# Patient Record
Sex: Male | Born: 1970
Health system: Southern US, Community
[De-identification: ages and names within clinical notes are randomized; demographics above are authoritative.]

## PROBLEM LIST (undated history)

## (undated) DIAGNOSIS — Z789 Other specified health status: Secondary | ICD-10-CM

## (undated) HISTORY — PX: WRIST FRACTURE SURGERY: SHX121

## (undated) HISTORY — PX: NO PAST SURGERIES: SHX2092

---

## 2004-10-31 ENCOUNTER — Emergency Department (HOSPITAL_COMMUNITY): Admission: EM | Admit: 2004-10-31 | Discharge: 2004-10-31 | Payer: Self-pay | Admitting: Emergency Medicine

## 2009-02-08 ENCOUNTER — Emergency Department (HOSPITAL_COMMUNITY): Admission: EM | Admit: 2009-02-08 | Discharge: 2009-02-08 | Payer: Self-pay | Admitting: Emergency Medicine

## 2009-04-04 ENCOUNTER — Emergency Department (HOSPITAL_COMMUNITY): Admission: RE | Admit: 2009-04-04 | Discharge: 2009-04-05 | Payer: Self-pay | Admitting: Family Medicine

## 2010-08-18 LAB — URINALYSIS, ROUTINE W REFLEX MICROSCOPIC
Glucose, UA: NEGATIVE mg/dL
Ketones, ur: >80 mg/dL — AB
Protein, ur: NEGATIVE mg/dL
Urobilinogen, UA: 0.2 mg/dL (ref 0.0–1.0)

## 2010-08-18 LAB — URINE MICROSCOPIC-ADD ON

## 2014-08-31 ENCOUNTER — Encounter (HOSPITAL_COMMUNITY): Payer: Self-pay | Admitting: Emergency Medicine

## 2014-08-31 ENCOUNTER — Emergency Department (HOSPITAL_COMMUNITY): Payer: Worker's Compensation

## 2014-08-31 ENCOUNTER — Emergency Department (HOSPITAL_COMMUNITY)
Admission: EM | Admit: 2014-08-31 | Discharge: 2014-08-31 | Disposition: A | Discharge status: Home / Self Care | Payer: Worker's Compensation | Attending: Emergency Medicine | Admitting: Emergency Medicine

## 2014-08-31 DIAGNOSIS — Y998 Other external cause status: Secondary | ICD-10-CM | POA: Insufficient documentation

## 2014-08-31 DIAGNOSIS — W11XXXA Fall on and from ladder, initial encounter: Secondary | ICD-10-CM | POA: Insufficient documentation

## 2014-08-31 DIAGNOSIS — Z791 Long term (current) use of non-steroidal anti-inflammatories (NSAID): Secondary | ICD-10-CM | POA: Diagnosis not present

## 2014-08-31 DIAGNOSIS — S6991XA Unspecified injury of right wrist, hand and finger(s), initial encounter: Secondary | ICD-10-CM | POA: Diagnosis present

## 2014-08-31 DIAGNOSIS — S62101A Fracture of unspecified carpal bone, right wrist, initial encounter for closed fracture: Secondary | ICD-10-CM

## 2014-08-31 DIAGNOSIS — Y9389 Activity, other specified: Secondary | ICD-10-CM | POA: Diagnosis not present

## 2014-08-31 DIAGNOSIS — Y92009 Unspecified place in unspecified non-institutional (private) residence as the place of occurrence of the external cause: Secondary | ICD-10-CM | POA: Diagnosis not present

## 2014-08-31 DIAGNOSIS — S52611A Displaced fracture of right ulna styloid process, initial encounter for closed fracture: Secondary | ICD-10-CM | POA: Diagnosis not present

## 2014-08-31 MED ORDER — HYDROCODONE-ACETAMINOPHEN 5-325 MG PO TABS: 1.0000 | ORAL_TABLET | Freq: Four times a day (QID) | ORAL | Status: DC | PRN

## 2014-08-31 MED ORDER — IBUPROFEN 800 MG PO TABS
800.0000 mg | ORAL_TABLET | Freq: Once | ORAL | Status: AC
  Administered 2014-08-31: 800 mg via ORAL
  Filled 2014-08-31: qty 1

## 2014-08-31 NOTE — ED Notes (Signed)
Pt c/o R wrist swelling and pain after falling off of a ladder yesterday. Pt denies any other injury, LOC. Pt A&Ox4 and ambulatory. Pt can move wrist and fingers with moderate pain.

## 2014-08-31 NOTE — Discharge Instructions (Signed)
Wrist Fracture °A wrist fracture is a break or crack in one of the bones of your wrist. Your wrist is made up of eight small bones at the palm of your hand (carpal bones) and two long bones that make up your forearm (radius and ulna).  °CAUSES  °· A direct blow to the wrist. °· Falling on an outstretched hand. °· Trauma, such as a car accident or a fall. °RISK FACTORS °Risk factors for wrist fracture include:  °· Participating in contact and high-risk sports, such as skiing, biking, and ice skating. °· Taking steroid medicines. °· Smoking. °· Being male. °· Being Caucasian. °· Drinking more than three alcoholic beverages per day. °· Having low or lowered bone density (osteoporosis or osteopenia). °· Age. Older adults have decreased bone density. °· Women who have had menopause. °· History of previous fractures. °SIGNS AND SYMPTOMS °Symptoms of wrist fractures include tenderness, bruising, and inflammation. Additionally, the wrist may hang in an odd position or appear deformed.  °DIAGNOSIS °Diagnosis may include: °· Physical exam. °· X-ray. °TREATMENT °Treatment depends on many factors, including the nature and location of the fracture, your age, and your activity level. Treatment for wrist fracture can be nonsurgical or surgical.  °Nonsurgical Treatment °A plaster cast or splint may be applied to your wrist if the bone is in a good position. If the fracture is not in good position, it may be necessary for your health care provider to realign it before applying a splint or cast. Usually, a cast or splint will be worn for several weeks.  °Surgical Treatment °Sometimes the position of the bone is so far out of place that surgery is required to apply a device to hold it together as it heals. Depending on the fracture, there are a number of options for holding the bone in place while it heals, such as a cast and metal pins.   °HOME CARE INSTRUCTIONS °· Keep your injured wrist elevated and move your fingers as much as  possible. °· Do not put pressure on any part of your cast or splint. It may break.   °· Use a plastic bag to protect your cast or splint from water while bathing or showering. Do not lower your cast or splint into water. °· Take medicines only as directed by your health care provider. °· Keep your cast or splint clean and dry. If it becomes wet, damaged, or suddenly feels too tight, contact your health care provider right away. °· Do not use any tobacco products including cigarettes, chewing tobacco, or electronic cigarettes. Tobacco can delay bone healing. If you need help quitting, ask your health care provider. °· Keep all follow-up visits as directed by your health care provider. This is important. °· Ask your health care provider if you should take supplements of calcium and vitamins C and D to promote bone healing. °SEEK MEDICAL CARE IF:  °· Your cast or splint is damaged, breaks, or gets wet. °· You have a fever. °· You have chills. °· You have continued severe pain or more swelling than you did before the cast was put on. °SEEK IMMEDIATE MEDICAL CARE IF:  °· Your hand or fingernails on the injured arm turn blue or gray, or feel cold or numb. °· You have decreased feeling in the fingers of your injured arm. °MAKE SURE YOU: °· Understand these instructions. °· Will watch your condition. °· Will get help right away if you are not doing well or get worse. °Document Released: 02/09/2005 Document Revised:   09/16/2013 Document Reviewed: 05/20/2011 Exit[employer] Patient Information 2015 [place], [place]. This information is not intended to replace advice given to you by your health care provider. Make sure you discuss any questions you have with your health care provider.  Cast or Splint Care Casts and splints support injured limbs and keep bones from moving while they heal. It is important to care for your cast or splint at home.  HOME CARE INSTRUCTIONS  Keep the cast or splint uncovered during the drying period.  It can take 24 to 48 hours to dry if it is made of plaster. A fiberglass cast will dry in less than 1 hour.  Do not rest the cast on anything harder than a pillow for the first 24 hours.  Do not put weight on your injured limb or apply pressure to the cast until your health care provider gives you permission.  Keep the cast or splint dry. Wet casts or splints can lose their shape and may not support the limb as well. A wet cast that has lost its shape can also create harmful pressure on your skin when it dries. Also, wet skin can become infected.  Cover the cast or splint with a plastic bag when bathing or when out in the rain or snow. If the cast is on the trunk of the body, take sponge baths until the cast is removed.  If your cast does become wet, dry it with a towel or a blow dryer on the cool setting only.  Keep your cast or splint clean. Soiled casts may be wiped with a moistened cloth.  Do not place any hard or soft foreign objects under your cast or splint, such as cotton, toilet paper, lotion, or powder.  Do not try to scratch the skin under the cast with any object. The object could get stuck inside the cast. Also, scratching could lead to an infection. If itching is a problem, use a blow dryer on a cool setting to relieve discomfort.  Do not trim or cut your cast or remove padding from inside of it.  Exercise all joints next to the injury that are not immobilized by the cast or splint. For example, if you have a long leg cast, exercise the hip joint and toes. If you have an arm cast or splint, exercise the shoulder, elbow, thumb, and fingers.  Elevate your injured arm or leg on 1 or 2 pillows for the first 1 to 3 days to decrease swelling and pain.It is best if you can comfortably elevate your cast so it is higher than your heart. SEEK MEDICAL CARE IF:   Your cast or splint cracks.  Your cast or splint is too tight or too loose.  You have unbearable itching inside the  cast.  Your cast becomes wet or develops a soft spot or area.  You have a bad smell coming from inside your cast.  You get an object stuck under your cast.  Your skin around the cast becomes red or raw.  You have new pain or worsening pain after the cast has been applied. SEEK IMMEDIATE MEDICAL CARE IF:   You have fluid leaking through the cast.  You are unable to move your fingers or toes.  You have discolored (blue or white), cool, painful, or very swollen fingers or toes beyond the cast.  You have tingling or numbness around the injured area.  You have severe pain or pressure under the cast.  You have any difficulty with your  breathing or have shortness of breath. °· You have chest pain. °Document Released: 04/29/2000 Document Revised: 02/20/2013 Document Reviewed: 11/08/2012 °ExitCare® Patient Information ©2015 [employer]. This information is not intended to replace advice given to you by your health care provider. Make sure you discuss any questions you have with your health care provider. ° ° °

## 2014-08-31 NOTE — ED Provider Notes (Signed)
CSN: [id number]     Arrival date & time 08/31/14  1125 History   First MD Initiated Contact with Patient 08/31/14 1136     Chief Complaint  Patient presents with  . Wrist Injury     (Consider location/radiation/quality/duration/timing/severity/associated sxs/prior Treatment) HPI   This is a 44 year old male who presents emergency with chief complaint of right wrist pain. Patient states that he was at home on a ladder doing some housework when he slipped, fell backward and landed upside down with all of his weight on his right hand. He had immediate severe pain and deformity. The patient states that he reached down and reduced his wrist. He states that he felt like he was going to pass out and became cold and sweaty during his self reduction. The patient went to his mother and sister who are involved in the medical field and some capacity, who told him that they thought it was not broken. He did not seek treatment yesterday. Patient states that his pain became worse progressively worse overnight and was unable to sleep. He's had worsening swelling, throbbing. He denies numbness or tingling in the hand. He took 1 Aleve without relief of his symptoms. He is right-hand dominant.   History reviewed. No pertinent past medical history. History reviewed. No pertinent past surgical history. No family history on file. History  Substance Use Topics  . Smoking status: Never Smoker   . Smokeless tobacco: Not on file  . Alcohol Use: No    Review of Systems  Ten systems reviewed and are negative for acute change, except as noted in the HPI.    Allergies  Codeine  Home Medications   Prior to Admission medications   Medication Sig Start Date End Date Taking? Authorizing Provider  naproxen sodium (ANAPROX) 220 MG tablet Take 220 mg by mouth 2 (two) times daily as needed (pain).   Yes Historical Provider, MD  HYDROcodone-acetaminophen (NORCO) 5-325 MG per tablet Take 1 tablet by mouth every 6  (six) hours as needed. 08/31/14   Kimbely Whiteaker, PA-C   BP 129/71 mmHg  Pulse 71  Temp(Src) 98.3 F (36.8 C) (Oral)  Resp 16  SpO2 99% Physical Exam  Constitutional: He appears well-developed and well-nourished. No distress.  HENT:  Head: Normocephalic and atraumatic.  Eyes: Conjunctivae are normal. No scleral icterus.  Neck: Normal range of motion. Neck supple.  Cardiovascular: Normal rate, regular rhythm and normal heart sounds.   Pulmonary/Chest: Effort normal and breath sounds normal. No respiratory distress.  Abdominal: Soft. There is no tenderness.  Musculoskeletal: He exhibits no edema.  A right wrist exam was performed. SKIN: intact SWELLING: moderate WARMTH: mildly increased warmth ROM: limited by pain TENDERNESS: diffuse and worst over the Distal radius and ulna STRENGTH: limited by pain  NEUROVASCULAR EXAM normal   Neurological: He is alert.  Skin: Skin is warm and dry. He is not diaphoretic.  Psychiatric: His behavior is normal.  Nursing note and vitals reviewed.   ED Course  Procedures (including critical care time) Labs Review Labs Reviewed - No data to display  Imaging Review Dg Wrist Complete Right  08/31/2014   CLINICAL DATA:  44 year old male with a history of acute trauma right wrist  EXAM: RIGHT WRIST - COMPLETE 3+ VIEW  COMPARISON:  02/08/2009  FINDINGS: Acute transverse fracture of the distal radius, without significant angulation on the lateral view. Fracture line extends to the radiocarpal joint space. Associated fracture of the ulnar styloid process.  Overlying soft tissue swelling.  No fracture line identified at the scaphoid.  Interval healing of the previously identified fourth and fifth metacarpal fractures.  IMPRESSION: Acute transverse fracture of the distal radius with extension of the fracture to the joint space. No significant angulation on the lateral view.  Ulnar styloid fracture.  Overlying soft tissue swelling.  Interval healing of prior  right fourth and fifth metacarpal fractures.  Signed,  Yvone NeuJaime S. Loreta AveWagner, DO  [facility]  [facility]   Electronically Signed   By: Gilmer MorJaime  Wagner D.O.   On: 08/31/2014 12:04     EKG Interpretation None      SPLINT APPLICATION Date/Time: 12:37 PM Authorized by: Arthor CaptainHarris, Ted Goodner Consent: Verbal consent obtained. Risks and benefits: risks, benefits and alternatives were discussed Consent given by: patient Splint applied by: orthopedic technician Location details: Right wrist  Splint type: Sugar tong  Post-procedure: The splinted body part was neurovascularly unchanged following the procedure. Patient tolerance: Patient tolerated the procedure well with no immediate complications.    MDM   Final diagnoses:  Wrist fracture, right, closed, initial encounter    Patient with acute transverse fracture of the distal radius with extension of the fracture into the joint space. No severe angulation. There is also a distal ulnar styloid fracture. Fractures involving articulating surfaces. Patient placed in sugar tong splint. He'll be discharged with pain medication and anti-inflammatories. Rice protocol discussed. Patient advised to follow-up as soon as possible with Dr. Creig Hinesboatman    Melenda Bielak, PA-C 08/31/14 1237  Doug SouSam Jacubowitz, MD 08/31/14 [phone]

## 2014-09-08 ENCOUNTER — Encounter (HOSPITAL_COMMUNITY): Payer: Self-pay

## 2014-09-08 ENCOUNTER — Encounter (HOSPITAL_COMMUNITY)
Admission: RE | Admit: 2014-09-08 | Discharge: 2014-09-08 | Disposition: A | Discharge status: Home / Self Care | Payer: Worker's Compensation | Source: Ambulatory Visit | Attending: Orthopedic Surgery | Admitting: Orthopedic Surgery

## 2014-09-08 DIAGNOSIS — Z01812 Encounter for preprocedural laboratory examination: Secondary | ICD-10-CM | POA: Diagnosis present

## 2014-09-08 LAB — CBC
HCT: 39.1 % (ref 39.0–52.0)
Hemoglobin: 13.1 g/dL (ref 13.0–17.0)
MCH: 30.5 pg (ref 26.0–34.0)
MCHC: 33.5 g/dL (ref 30.0–36.0)
MCV: 90.9 fL (ref 78.0–100.0)
Platelets: 264 10*3/uL (ref 150–400)
RBC: 4.3 MIL/uL (ref 4.22–5.81)
RDW: 14.1 % (ref 11.5–15.5)
WBC: 8.7 10*3/uL (ref 4.0–10.5)

## 2014-09-08 NOTE — Progress Notes (Signed)
No presurgical orders office notified, Drue FlirtCandy will send Dr Orlan Leavensrtman a message

## 2014-09-08 NOTE — Pre-Procedure Instructions (Signed)
[place] Billing  09/08/2014   Your procedure is scheduled on:  September 10, 2014  [facility] at 2:30 PM.  Call this number if you have problems the morning of surgery: [phone]   Remember:   Do not eat food or drink liquids after midnight.   Take these medicines the morning of surgery with A SIP OF WATER: HYDROcodone-acetaminophen (NORCO) if needed   STOP ASPIRIN, HERBAL SUPPLEMENTS, NSAIDS (NAPROXEN, ADVIL, IBUPROFEN) AS OF TODAY   Do not wear jewelry.  Do not wear lotions, powders, or colognes. You may NOT wear deodorant.  Men may shave face and neck.  Do not bring valuables to the hospital.  [facility] is not responsible for any belongings or valuables.               Contacts, dentures or bridgework may not be worn into surgery.  Leave suitcase in the car. After surgery it may be brought to your room.  For patients admitted to the hospital, discharge time is determined by your  treatment team.               Patients discharged the day of surgery will not be allowed to drive  home.  Name and phone number of your driver: FAMILY/FRIEND  Special Instructions: "preparing for surgery"   Please read over the following fact sheets that you were given: Pain Booklet, Coughing and Deep Breathing and Surgical Site Infection Prevention

## 2014-09-10 ENCOUNTER — Encounter (HOSPITAL_COMMUNITY): Admission: RE | Payer: Self-pay | Source: Ambulatory Visit

## 2014-09-10 ENCOUNTER — Ambulatory Visit (HOSPITAL_COMMUNITY)
Admission: RE | Admit: 2014-09-10 | Payer: Worker's Compensation | Source: Ambulatory Visit | Admitting: Orthopedic Surgery

## 2014-09-10 SURGERY — OPEN REDUCTION INTERNAL FIXATION (ORIF) DISTAL RADIUS FRACTURE
Anesthesia: General | Laterality: Right

## 2015-01-25 ENCOUNTER — Encounter (HOSPITAL_COMMUNITY): Payer: Self-pay | Admitting: Emergency Medicine

## 2015-01-25 ENCOUNTER — Emergency Department (HOSPITAL_COMMUNITY)
Admission: EM | Admit: 2015-01-25 | Discharge: 2015-01-25 | Disposition: A | Discharge status: Home / Self Care | Payer: Self-pay | Attending: Emergency Medicine | Admitting: Emergency Medicine

## 2015-01-25 ENCOUNTER — Emergency Department (HOSPITAL_COMMUNITY): Payer: Self-pay

## 2015-01-25 DIAGNOSIS — Y92410 Unspecified street and highway as the place of occurrence of the external cause: Secondary | ICD-10-CM | POA: Insufficient documentation

## 2015-01-25 DIAGNOSIS — S6992XA Unspecified injury of left wrist, hand and finger(s), initial encounter: Secondary | ICD-10-CM | POA: Insufficient documentation

## 2015-01-25 DIAGNOSIS — Y998 Other external cause status: Secondary | ICD-10-CM | POA: Insufficient documentation

## 2015-01-25 DIAGNOSIS — Y9389 Activity, other specified: Secondary | ICD-10-CM | POA: Insufficient documentation

## 2015-01-25 DIAGNOSIS — M25532 Pain in left wrist: Secondary | ICD-10-CM

## 2015-01-25 LAB — URINALYSIS, ROUTINE W REFLEX MICROSCOPIC
Bilirubin Urine: NEGATIVE
Glucose, UA: NEGATIVE mg/dL
Hgb urine dipstick: NEGATIVE
Ketones, ur: NEGATIVE mg/dL
LEUKOCYTES UA: NEGATIVE
NITRITE: NEGATIVE
PH: 7 (ref 5.0–8.0)
Protein, ur: NEGATIVE mg/dL
SPECIFIC GRAVITY, URINE: 1.015 (ref 1.005–1.030)
Urobilinogen, UA: 0.2 mg/dL (ref 0.0–1.0)

## 2015-01-25 MED ORDER — IBUPROFEN 400 MG PO TABS: 400.0000 mg | ORAL_TABLET | Freq: Four times a day (QID) | ORAL | Status: DC | PRN

## 2015-01-25 MED ORDER — IBUPROFEN 800 MG PO TABS
800.0000 mg | ORAL_TABLET | Freq: Once | ORAL | Status: AC
  Administered 2015-01-25: 800 mg via ORAL
  Filled 2015-01-25 (×2): qty 1
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
## 2015-01-25 NOTE — Discharge Instructions (Signed)
Return without fail for worsening symptoms, including numbness or weakness in your hand, worsening pain, or any other symptoms concerning to you.   Wrist Pain A wrist sprain happens when the bands of tissue that hold the wrist joints together (ligament) stretch too much or tear. A wrist strain happens when muscles or bands of tissue that connect muscles to bones (tendons) are stretched or pulled. HOME CARE  Put ice on the injured area.  Put ice in a plastic bag.  Place a towel between your skin and the bag.  Leave the ice on for 15-20 minutes, 03-04 times a day, for the first 2 days.  Raise (elevate) the injured wrist to lessen puffiness (swelling).  Rest the injured wrist for at least 48 hours or as told by your doctor.  Wear a splint, cast, or an elastic wrap as told by your doctor.  Only take medicine as told by your doctor.  Follow up with your doctor as told. This is important. GET HELP RIGHT AWAY IF:   The fingers are puffy, very red, white, or cold and blue.  The fingers lose feeling (numb) or tingle.  The pain gets worse.  It is hard to move the fingers. MAKE SURE YOU:   Understand these instructions.  Will watch your condition.  Will get help right away if you are not doing well or get worse. Document Released: 10/19/2007 Document Revised: 07/25/2011 Document Reviewed: 06/23/2010 [facility] Patient Information 2015 [place], [place]. This information is not intended to replace advice given to you by your health care provider. Make sure you discuss any questions you have with your health care provider.
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
## 2015-01-25 NOTE — ED Notes (Signed)
Pt states he" will not be able to void until 3 pm"

## 2015-01-25 NOTE — ED Notes (Signed)
Pt verbalized understanding of dc instructions prescriptions and follow up care. As well as splint care.

## 2015-02-02 ENCOUNTER — Emergency Department (HOSPITAL_COMMUNITY)
Admission: EM | Admit: 2015-02-02 | Discharge: 2015-02-02 | Disposition: A | Discharge status: Home / Self Care | Payer: Self-pay | Attending: Emergency Medicine | Admitting: Emergency Medicine

## 2015-02-02 ENCOUNTER — Emergency Department (HOSPITAL_COMMUNITY): Payer: Self-pay

## 2015-02-02 ENCOUNTER — Encounter (HOSPITAL_COMMUNITY): Payer: Self-pay | Admitting: Emergency Medicine

## 2015-02-02 DIAGNOSIS — M79642 Pain in left hand: Secondary | ICD-10-CM | POA: Insufficient documentation
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
## 2015-06-25 ENCOUNTER — Emergency Department (HOSPITAL_COMMUNITY)
Admission: EM | Admit: 2015-06-25 | Discharge: 2015-06-25 | Disposition: A | Discharge status: Home / Self Care | Payer: Self-pay | Attending: Emergency Medicine | Admitting: Emergency Medicine

## 2015-06-25 ENCOUNTER — Encounter (HOSPITAL_COMMUNITY): Payer: Self-pay

## 2015-06-25 DIAGNOSIS — B029 Zoster without complications: Secondary | ICD-10-CM | POA: Insufficient documentation

## 2015-06-25 MED ORDER — OXYCODONE-ACETAMINOPHEN 5-325 MG PO TABS: 2.0000 | ORAL_TABLET | Freq: Four times a day (QID) | ORAL | Status: DC | PRN

## 2015-06-25 MED ORDER — ACYCLOVIR 400 MG PO TABS: 800.0000 mg | ORAL_TABLET | Freq: Every day | ORAL | Status: DC

## 2015-06-25 MED FILL — OXYCODONE/APAP 5/325MG: 5-325 | 8 days supply | Qty: 15 | Fill #0

## 2015-06-25 MED FILL — ACYCLOVIR 400 MG TABLET: 400 | 10 days supply | Qty: 100 | Fill #0

## 2015-06-25 NOTE — ED Notes (Signed)
Pt c/o painful rash on R side mid back w/ pain radiating into R ribcage x   "3-4 days."  Pain score 6/10.  Pt reports having chicken pox x 2 as a child.
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
## 2015-06-25 NOTE — Discharge Instructions (Signed)

## 2016-07-12 MED FILL — AMOXICILLIN 500 MG CAPSULE: 500 | 7 days supply | Qty: 28 | Fill #0

## 2017-04-16 ENCOUNTER — Encounter (HOSPITAL_COMMUNITY): Payer: Self-pay | Admitting: Emergency Medicine

## 2017-04-16 ENCOUNTER — Emergency Department (HOSPITAL_COMMUNITY)
Admission: EM | Admit: 2017-04-16 | Discharge: 2017-04-16 | Disposition: A | Discharge status: Home / Self Care | Payer: Self-pay | Attending: Emergency Medicine | Admitting: Emergency Medicine

## 2017-04-16 DIAGNOSIS — Z79899 Other long term (current) drug therapy: Secondary | ICD-10-CM | POA: Insufficient documentation

## 2017-04-16 DIAGNOSIS — H029 Unspecified disorder of eyelid: Secondary | ICD-10-CM

## 2017-04-16 DIAGNOSIS — H018 Other specified inflammations of eyelid: Secondary | ICD-10-CM | POA: Insufficient documentation

## 2017-04-16 DIAGNOSIS — Z87891 Personal history of nicotine dependence: Secondary | ICD-10-CM | POA: Insufficient documentation

## 2017-04-16 MED ORDER — CEPHALEXIN 500 MG PO CAPS: 500.0000 mg | ORAL_CAPSULE | Freq: Two times a day (BID) | ORAL | 0 refills | Status: AC

## 2017-04-16 MED ORDER — IBUPROFEN 800 MG PO TABS: 800.0000 mg | ORAL_TABLET | Freq: Three times a day (TID) | ORAL | 0 refills | Status: DC | PRN

## 2017-04-16 NOTE — ED Triage Notes (Signed)
Pt. Stated, I've had something going on with my left eye for about a month. I have a growth on there now and it feels like something in my eye all the time.
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
## 2017-04-16 NOTE — Discharge Instructions (Signed)
You were seen today with a mass on the eyelid. This needs to be evaluated by an eye specialist. Call tomorrow for the next available appointment. Apply warm compresses and take the antibiotics as prescribed.

## 2017-04-16 NOTE — ED Notes (Signed)
Pt is not present in waiting area, could not update vitals.

## 2017-06-29 ENCOUNTER — Inpatient Hospital Stay (HOSPITAL_COMMUNITY)
Admission: EM | Admit: 2017-06-29 | Discharge: 2017-07-05 | DRG: 089 | Discharge status: Against Medical Advice | Payer: No Typology Code available for payment source | Attending: General Surgery | Admitting: General Surgery

## 2017-06-29 ENCOUNTER — Emergency Department (HOSPITAL_COMMUNITY): Payer: No Typology Code available for payment source

## 2017-06-29 ENCOUNTER — Other Ambulatory Visit: Payer: Self-pay

## 2017-06-29 DIAGNOSIS — Y92410 Unspecified street and highway as the place of occurrence of the external cause: Secondary | ICD-10-CM

## 2017-06-29 DIAGNOSIS — R4182 Altered mental status, unspecified: Secondary | ICD-10-CM | POA: Diagnosis not present

## 2017-06-29 DIAGNOSIS — R0682 Tachypnea, not elsewhere classified: Secondary | ICD-10-CM

## 2017-06-29 DIAGNOSIS — D72829 Elevated white blood cell count, unspecified: Secondary | ICD-10-CM

## 2017-06-29 DIAGNOSIS — R402362 Coma scale, best motor response, obeys commands, at arrival to emergency department: Secondary | ICD-10-CM | POA: Diagnosis present

## 2017-06-29 DIAGNOSIS — D62 Acute posthemorrhagic anemia: Secondary | ICD-10-CM | POA: Diagnosis present

## 2017-06-29 DIAGNOSIS — Z885 Allergy status to narcotic agent status: Secondary | ICD-10-CM

## 2017-06-29 DIAGNOSIS — R402222 Coma scale, best verbal response, incomprehensible words, at arrival to emergency department: Secondary | ICD-10-CM | POA: Diagnosis present

## 2017-06-29 DIAGNOSIS — Z91030 Bee allergy status: Secondary | ICD-10-CM

## 2017-06-29 DIAGNOSIS — R6510 Systemic inflammatory response syndrome (SIRS) of non-infectious origin without acute organ dysfunction: Secondary | ICD-10-CM | POA: Diagnosis present

## 2017-06-29 DIAGNOSIS — R402112 Coma scale, eyes open, never, at arrival to emergency department: Secondary | ICD-10-CM | POA: Diagnosis present

## 2017-06-29 DIAGNOSIS — S060X9A Concussion with loss of consciousness of unspecified duration, initial encounter: Secondary | ICD-10-CM | POA: Diagnosis not present

## 2017-06-29 DIAGNOSIS — S0101XA Laceration without foreign body of scalp, initial encounter: Secondary | ICD-10-CM | POA: Diagnosis present

## 2017-06-29 DIAGNOSIS — Z781 Physical restraint status: Secondary | ICD-10-CM

## 2017-06-29 DIAGNOSIS — J9690 Respiratory failure, unspecified, unspecified whether with hypoxia or hypercapnia: Secondary | ICD-10-CM

## 2017-06-29 DIAGNOSIS — T1490XA Injury, unspecified, initial encounter: Secondary | ICD-10-CM

## 2017-06-29 DIAGNOSIS — R000 Tachycardia, unspecified: Secondary | ICD-10-CM

## 2017-06-29 DIAGNOSIS — Z23 Encounter for immunization: Secondary | ICD-10-CM

## 2017-06-29 DIAGNOSIS — S60414A Abrasion of right ring finger, initial encounter: Secondary | ICD-10-CM | POA: Diagnosis present

## 2017-06-29 HISTORY — DX: Other specified health status: Z78.9

## 2017-06-29 LAB — COMPREHENSIVE METABOLIC PANEL
ALK PHOS: 60 U/L (ref 38–126)
ALT: 27 U/L (ref 17–63)
AST: 41 U/L (ref 15–41)
Albumin: 3.8 g/dL (ref 3.5–5.0)
Anion gap: 12 (ref 5–15)
BILIRUBIN TOTAL: 0.7 mg/dL (ref 0.3–1.2)
BUN: 11 mg/dL (ref 6–20)
CALCIUM: 8.6 mg/dL — AB (ref 8.9–10.3)
CO2: 22 mmol/L (ref 22–32)
Chloride: 107 mmol/L (ref 101–111)
Creatinine, Ser: 1.01 mg/dL (ref 0.61–1.24)
GLUCOSE: 161 mg/dL — AB (ref 65–99)
Potassium: 3.2 mmol/L — ABNORMAL LOW (ref 3.5–5.1)
Sodium: 141 mmol/L (ref 135–145)
TOTAL PROTEIN: 6.4 g/dL — AB (ref 6.5–8.1)

## 2017-06-29 LAB — PREPARE FRESH FROZEN PLASMA
UNIT DIVISION: 0
Unit division: 0

## 2017-06-29 LAB — BPAM FFP
BLOOD PRODUCT EXPIRATION DATE: 201902172359
Blood Product Expiration Date: 201902172359
ISSUE DATE / TIME: 201902140808
ISSUE DATE / TIME: 201902140808
Unit Type and Rh: 6200
Unit Type and Rh: 6200

## 2017-06-29 LAB — I-STAT CHEM 8, ED
BUN: 14 mg/dL (ref 6–20)
CALCIUM ION: 1.08 mmol/L — AB (ref 1.15–1.40)
CHLORIDE: 106 mmol/L (ref 101–111)
CREATININE: 0.9 mg/dL (ref 0.61–1.24)
GLUCOSE: 161 mg/dL — AB (ref 65–99)
HCT: 43 % (ref 39.0–52.0)
Hemoglobin: 14.6 g/dL (ref 13.0–17.0)
Potassium: 3.4 mmol/L — ABNORMAL LOW (ref 3.5–5.1)
Sodium: 143 mmol/L (ref 135–145)
TCO2: 25 mmol/L (ref 22–32)

## 2017-06-29 LAB — BPAM RBC
BLOOD PRODUCT EXPIRATION DATE: 201902202359
BLOOD PRODUCT EXPIRATION DATE: 201903052359
ISSUE DATE / TIME: 201902140806
ISSUE DATE / TIME: 201902140806
UNIT TYPE AND RH: 9500
Unit Type and Rh: 9500

## 2017-06-29 LAB — I-STAT CG4 LACTIC ACID, ED: Lactic Acid, Venous: 2.8 mmol/L (ref 0.5–1.9)

## 2017-06-29 LAB — TYPE AND SCREEN
ABO/RH(D): A POS
Antibody Screen: NEGATIVE
UNIT DIVISION: 0
UNIT DIVISION: 0

## 2017-06-29 LAB — URINALYSIS, ROUTINE W REFLEX MICROSCOPIC
Bacteria, UA: NONE SEEN
Bilirubin Urine: NEGATIVE
GLUCOSE, UA: 50 mg/dL — AB
KETONES UR: NEGATIVE mg/dL
Leukocytes, UA: NEGATIVE
NITRITE: NEGATIVE
PH: 5 (ref 5.0–8.0)
Protein, ur: NEGATIVE mg/dL
Specific Gravity, Urine: >1.046 — ABNORMAL HIGH (ref 1.005–1.030)

## 2017-06-29 LAB — CBC
HCT: 41.9 % (ref 39.0–52.0)
HCT: 40.1 % (ref 39.0–52.0)
HEMOGLOBIN: 14.2 g/dL (ref 13.0–17.0)
HEMOGLOBIN: 13.4 g/dL (ref 13.0–17.0)
MCH: 31.2 pg (ref 26.0–34.0)
MCH: 30.6 pg (ref 26.0–34.0)
MCHC: 33.9 g/dL (ref 30.0–36.0)
MCHC: 33.4 g/dL (ref 30.0–36.0)
MCV: 92.1 fL (ref 78.0–100.0)
MCV: 91.6 fL (ref 78.0–100.0)
Platelets: 288 10*3/uL (ref 150–400)
Platelets: 253 10*3/uL (ref 150–400)
RBC: 4.55 MIL/uL (ref 4.22–5.81)
RBC: 4.38 MIL/uL (ref 4.22–5.81)
RDW: 13.8 % (ref 11.5–15.5)
RDW: 14.2 % (ref 11.5–15.5)
WBC: 11.7 10*3/uL — AB (ref 4.0–10.5)
WBC: 22.9 10*3/uL — AB (ref 4.0–10.5)

## 2017-06-29 LAB — CREATININE, SERUM
CREATININE: 0.88 mg/dL (ref 0.61–1.24)
GFR calc Af Amer: >60 mL/min (ref >60)

## 2017-06-29 LAB — BLOOD PRODUCT ORDER (VERBAL) VERIFICATION

## 2017-06-29 LAB — ABO/RH: ABO/RH(D): A POS

## 2017-06-29 LAB — ETHANOL

## 2017-06-29 LAB — PROTIME-INR
INR: 1.03
PROTHROMBIN TIME: 13.4 s (ref 11.4–15.2)

## 2017-06-29 MED ORDER — TETANUS-DIPHTH-ACELL PERTUSSIS 5-2.5-18.5 LF-MCG/0.5 IM SUSP
0.5000 mL | Freq: Once | INTRAMUSCULAR | Status: AC
  Administered 2017-06-29: 0.5 mL via INTRAMUSCULAR
  Filled 2017-06-29: qty 0.5

## 2017-06-29 MED ORDER — PANTOPRAZOLE SODIUM 40 MG PO TBEC
40.0000 mg | DELAYED_RELEASE_TABLET | Freq: Every day | ORAL | Status: DC
  Administered 2017-07-03 – 2017-07-04 (×2): 40 mg via ORAL
  Filled 2017-06-29 (×2): qty 1

## 2017-06-29 MED ORDER — SUCCINYLCHOLINE CHLORIDE 20 MG/ML IJ SOLN
INTRAMUSCULAR | Status: AC | PRN
  Administered 2017-06-29: 100 mg via INTRAVENOUS

## 2017-06-29 MED ORDER — ONDANSETRON 4 MG PO TBDP: 4.0000 mg | ORAL_TABLET | Freq: Four times a day (QID) | ORAL | Status: DC | PRN

## 2017-06-29 MED ORDER — MIDAZOLAM HCL 2 MG/2ML IJ SOLN
INTRAMUSCULAR | Status: AC
Start: 2017-06-29 — End: 2017-06-29
  Filled 2017-06-29: qty 4

## 2017-06-29 MED ORDER — OXYCODONE HCL 5 MG PO TABS
2.5000 mg | ORAL_TABLET | ORAL | Status: DC | PRN
  Administered 2017-07-02 – 2017-07-04 (×4): 5 mg via ORAL
  Filled 2017-06-29 (×4): qty 1

## 2017-06-29 MED ORDER — PROPOFOL 10 MG/ML IV BOLUS
INTRAVENOUS | Status: AC | PRN
  Administered 2017-06-29: 1451.52 ug via INTRAVENOUS
  Administered 2017-06-29: 2177.28 ug via INTRAVENOUS

## 2017-06-29 MED ORDER — FENTANYL CITRATE (PF) 100 MCG/2ML IJ SOLN
INTRAMUSCULAR | Status: AC
  Filled 2017-06-29: qty 2

## 2017-06-29 MED ORDER — ENOXAPARIN SODIUM 40 MG/0.4ML ~~LOC~~ SOLN
40.0000 mg | SUBCUTANEOUS | Status: DC
  Administered 2017-06-30 – 2017-07-04 (×5): 40 mg via SUBCUTANEOUS
  Filled 2017-06-29 (×5): qty 0.4

## 2017-06-29 MED ORDER — IOPAMIDOL (ISOVUE-300) INJECTION 61%
INTRAVENOUS | Status: AC
  Filled 2017-06-29: qty 100

## 2017-06-29 MED ORDER — LORAZEPAM 2 MG/ML IJ SOLN
2.0000 mg | INTRAMUSCULAR | Status: DC | PRN
  Administered 2017-06-29 – 2017-07-03 (×7): 2 mg via INTRAVENOUS
  Filled 2017-06-29 (×7): qty 1

## 2017-06-29 MED ORDER — DOCUSATE SODIUM 100 MG PO CAPS
100.0000 mg | ORAL_CAPSULE | Freq: Two times a day (BID) | ORAL | Status: DC
  Administered 2017-07-02 – 2017-07-04 (×5): 100 mg via ORAL
  Filled 2017-06-29 (×5): qty 1

## 2017-06-29 MED ORDER — METOPROLOL TARTRATE 5 MG/5ML IV SOLN: 5.0000 mg | Freq: Four times a day (QID) | INTRAVENOUS | Status: DC | PRN

## 2017-06-29 MED ORDER — POTASSIUM CHLORIDE IN NACL 20-0.45 MEQ/L-% IV SOLN
INTRAVENOUS | Status: DC
  Administered 2017-06-29 – 2017-07-04 (×7): via INTRAVENOUS
  Filled 2017-06-29 (×11): qty 1000

## 2017-06-29 MED ORDER — FENTANYL CITRATE (PF) 100 MCG/2ML IJ SOLN
INTRAMUSCULAR | Status: AC | PRN
  Administered 2017-06-29 (×2): 100 ug via INTRAVENOUS

## 2017-06-29 MED ORDER — ACETAMINOPHEN 325 MG PO TABS: 650.0000 mg | ORAL_TABLET | ORAL | Status: DC | PRN

## 2017-06-29 MED ORDER — FENTANYL CITRATE (PF) 100 MCG/2ML IJ SOLN: 75.0000 ug | INTRAMUSCULAR | Status: DC | PRN

## 2017-06-29 MED ORDER — ETOMIDATE 2 MG/ML IV SOLN
INTRAVENOUS | Status: AC | PRN
Start: 2017-06-29 — End: 2017-06-29
  Administered 2017-06-29: 20 mg via INTRAVENOUS

## 2017-06-29 MED ORDER — MIDAZOLAM HCL 5 MG/5ML IJ SOLN
INTRAMUSCULAR | Status: AC | PRN
  Administered 2017-06-29: 4 mg via INTRAVENOUS

## 2017-06-29 MED ORDER — HYDROMORPHONE HCL 1 MG/ML IJ SOLN: 0.5000 mg | INTRAMUSCULAR | Status: DC | PRN

## 2017-06-29 MED ORDER — ONDANSETRON HCL 4 MG/2ML IJ SOLN
4.0000 mg | Freq: Four times a day (QID) | INTRAMUSCULAR | Status: DC | PRN
  Administered 2017-06-29: 4 mg via INTRAVENOUS
  Filled 2017-06-29 (×3): qty 2

## 2017-06-29 MED ORDER — PROPOFOL 1000 MG/100ML IV EMUL
INTRAVENOUS | Status: AC
  Filled 2017-06-29: qty 100

## 2017-06-29 MED ORDER — PANTOPRAZOLE SODIUM 40 MG IV SOLR
40.0000 mg | Freq: Every day | INTRAVENOUS | Status: DC
  Administered 2017-06-29 – 2017-07-02 (×4): 40 mg via INTRAVENOUS
  Filled 2017-06-29 (×5): qty 40

## 2017-06-29 MED ORDER — ONDANSETRON HCL 4 MG/2ML IJ SOLN
4.0000 mg | Freq: Four times a day (QID) | INTRAMUSCULAR | Status: DC | PRN
  Administered 2017-06-29 (×2): 4 mg via INTRAVENOUS

## 2017-06-29 MED ORDER — SODIUM CHLORIDE 0.9 % IV SOLN
INTRAVENOUS | Status: AC | PRN
  Administered 2017-06-29: 1000 mL via INTRAVENOUS

## 2017-06-29 MED ORDER — FENTANYL CITRATE (PF) 100 MCG/2ML IJ SOLN
INTRAMUSCULAR | Status: AC
  Filled 2017-06-29: qty 4
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
## 2017-06-29 NOTE — ED Notes (Signed)
Returned from CT patient continues to pull at his tubes  Soft restraints applied. Positive bilateral radial pulses. Good circulation

## 2017-06-29 NOTE — ED Notes (Signed)
Patient still confused, will not follow commands, mother at bedside. [facility] , will come and reeval. Patient and speak with mother.

## 2017-06-29 NOTE — ED Notes (Signed)
Patient is stable and ready to be transport to the floor at this time.  Report was called to 4N progressive RN.  Belongings taken with the patient to the floor.

## 2017-06-29 NOTE — ED Notes (Signed)
Pt had a decreased in mental status after arriving to [facility] [place], preparing to intubate.

## 2017-06-29 NOTE — ED Notes (Signed)
Mother at bedside. Patient is awake however isn't talking , soft restraints removed. Mother at bedside, not talking with mother. Sleeping at present.

## 2017-06-29 NOTE — ED Notes (Signed)
Patient attempted to stand up to urinate. Patient unable to urinate at this time.  Unable to follow commands.  Sat back in the bed safely.  Throughout HR jumped to 130s with BP maintaining.  Mom remains at bedside.

## 2017-06-29 NOTE — Progress Notes (Signed)
Pt extubated per Dr Lindie SpruceWyatt. Pt spoke briefly, no stridor or SOB noted. Positive cuff leak present prior to extubation. Placed on 4L [place]. Pt tol well. Will cont to monitor
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
## 2017-06-29 NOTE — ED Notes (Signed)
Lab results reported to Nurse Kelly. 

## 2017-06-29 NOTE — Progress Notes (Signed)
Pt intubated for airway protection by EDP. Pt placed on vent with generic settings. Pt tol well once sedated adequately. Vt 620, RR 16, FIO2 40%, peep 5.  Will cont to monitor

## 2017-06-29 NOTE — ED Notes (Signed)
Patient tried to get out of bed again, nauseated and began throwing up.  Zofran administered at this time.  Patient still not following commands and seems very confused.

## 2017-06-29 NOTE — ED Notes (Signed)
Attempting to sit up patient gets nauseated medication given.

## 2017-06-29 NOTE — ED Notes (Signed)
Pt difficult to sedate, continuing to give sedation medication.
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
## 2017-06-29 NOTE — ED Notes (Signed)
Patient stating he needed to "pee" refused to use urinal laying down. With assistance stood patient up and he urinated in the urinal.  Clean linens and gown applied. Patient still not oriented. Pupils 4 bilaterally and reactive. Acted like he was going to throw up rolled patient to his side and suction available.didn't vomit.

## 2017-06-30 DIAGNOSIS — Y92410 Unspecified street and highway as the place of occurrence of the external cause: Secondary | ICD-10-CM | POA: Diagnosis not present

## 2017-06-30 DIAGNOSIS — R4182 Altered mental status, unspecified: Secondary | ICD-10-CM | POA: Diagnosis present

## 2017-06-30 DIAGNOSIS — Z91030 Bee allergy status: Secondary | ICD-10-CM | POA: Diagnosis not present

## 2017-06-30 DIAGNOSIS — R402112 Coma scale, eyes open, never, at arrival to emergency department: Secondary | ICD-10-CM | POA: Diagnosis present

## 2017-06-30 DIAGNOSIS — Z781 Physical restraint status: Secondary | ICD-10-CM | POA: Diagnosis not present

## 2017-06-30 DIAGNOSIS — D62 Acute posthemorrhagic anemia: Secondary | ICD-10-CM | POA: Diagnosis present

## 2017-06-30 DIAGNOSIS — S0101XA Laceration without foreign body of scalp, initial encounter: Secondary | ICD-10-CM | POA: Diagnosis present

## 2017-06-30 DIAGNOSIS — Z23 Encounter for immunization: Secondary | ICD-10-CM | POA: Diagnosis not present

## 2017-06-30 DIAGNOSIS — S060X9A Concussion with loss of consciousness of unspecified duration, initial encounter: Secondary | ICD-10-CM | POA: Diagnosis present

## 2017-06-30 DIAGNOSIS — Z885 Allergy status to narcotic agent status: Secondary | ICD-10-CM | POA: Diagnosis not present

## 2017-06-30 DIAGNOSIS — S60414A Abrasion of right ring finger, initial encounter: Secondary | ICD-10-CM | POA: Diagnosis present

## 2017-06-30 DIAGNOSIS — R6510 Systemic inflammatory response syndrome (SIRS) of non-infectious origin without acute organ dysfunction: Secondary | ICD-10-CM | POA: Diagnosis present

## 2017-06-30 DIAGNOSIS — R402362 Coma scale, best motor response, obeys commands, at arrival to emergency department: Secondary | ICD-10-CM | POA: Diagnosis present

## 2017-06-30 DIAGNOSIS — R402222 Coma scale, best verbal response, incomprehensible words, at arrival to emergency department: Secondary | ICD-10-CM | POA: Diagnosis present

## 2017-06-30 LAB — BASIC METABOLIC PANEL
ANION GAP: 11 (ref 5–15)
BUN: 9 mg/dL (ref 6–20)
CHLORIDE: 105 mmol/L (ref 101–111)
CO2: 22 mmol/L (ref 22–32)
Calcium: 8.7 mg/dL — ABNORMAL LOW (ref 8.9–10.3)
Creatinine, Ser: 0.86 mg/dL (ref 0.61–1.24)
GFR calc non Af Amer: >60 mL/min (ref >60)
Glucose, Bld: 104 mg/dL — ABNORMAL HIGH (ref 65–99)
POTASSIUM: 3.7 mmol/L (ref 3.5–5.1)
SODIUM: 138 mmol/L (ref 135–145)

## 2017-06-30 LAB — CBC
HEMATOCRIT: 37.5 % — AB (ref 39.0–52.0)
Hemoglobin: 12.5 g/dL — ABNORMAL LOW (ref 13.0–17.0)
MCH: 30.2 pg (ref 26.0–34.0)
MCHC: 33.3 g/dL (ref 30.0–36.0)
MCV: 90.6 fL (ref 78.0–100.0)
Platelets: 220 10*3/uL (ref 150–400)
RBC: 4.14 MIL/uL — AB (ref 4.22–5.81)
RDW: 13.7 % (ref 11.5–15.5)
WBC: 12 10*3/uL — AB (ref 4.0–10.5)
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
## 2017-06-30 NOTE — Progress Notes (Signed)
OT Cancellation Note  Patient Details Name: Andrew Thompson MRN: [id number] DOB: Mar 14, 1971   Cancelled Treatment:    Reason Eval/Treat Not Completed: Other (comment). Pt agitated, has been pulling out IVs, and kicking/hitting at nurses--RN asks to hold for now.  Andrew Thompson, Andrew Thompson [phone] 06/30/2017, 3:09 PM

## 2017-06-30 NOTE — Progress Notes (Signed)
Pt is impulsive and has poor safety awareness. RN tried to redirect pt from getting out of bed as he is very unsteady on his feet and requires 2 person assist. RN tried to re-orient pt without success. MD page and soft waist restraints ordered. Safety mitts in place; pt pulling at lines.

## 2017-06-30 NOTE — Progress Notes (Signed)
Pt is combative. He is kicking and hitting at staff. He pulled IV out. He used his teeth to get safety mittens off. Staff is unable to redirect or reorient pt. MD paged and order for soft wrist and ankle restraints

## 2017-06-30 NOTE — Progress Notes (Signed)
Rehab Admissions Coordinator Note:  Patient was screened by Clois DupesBoyette, Eduin Friedel Godwin for appropriateness for an Inpatient Acute Rehab Consult per PT and SLP recommendations.   At this time, we are recommending await further progress over the weekend before determining rehab venue needs.I will follow.Clois Dupes.  Gibson Telleria Godwin 06/30/2017, 11:41 AM  I can be reached at [phone].
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
## 2017-07-01 LAB — CDS SEROLOGY

## 2017-07-01 LAB — HIV ANTIBODY (ROUTINE TESTING W REFLEX): HIV SCREEN 4TH GENERATION: NONREACTIVE
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
## 2017-07-02 LAB — BASIC METABOLIC PANEL
Anion gap: 12 (ref 5–15)
BUN: 11 mg/dL (ref 6–20)
CHLORIDE: 105 mmol/L (ref 101–111)
CO2: 24 mmol/L (ref 22–32)
Calcium: 8.6 mg/dL — ABNORMAL LOW (ref 8.9–10.3)
Creatinine, Ser: 0.94 mg/dL (ref 0.61–1.24)
GFR calc Af Amer: >60 mL/min (ref >60)
GFR calc non Af Amer: >60 mL/min (ref >60)
Glucose, Bld: 81 mg/dL (ref 65–99)
POTASSIUM: 3.7 mmol/L (ref 3.5–5.1)
SODIUM: 141 mmol/L (ref 135–145)

## 2017-07-02 LAB — MAGNESIUM: MAGNESIUM: 1.8 mg/dL (ref 1.7–2.4)
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
## 2017-07-03 DIAGNOSIS — D62 Acute posthemorrhagic anemia: Secondary | ICD-10-CM

## 2017-07-03 DIAGNOSIS — R6510 Systemic inflammatory response syndrome (SIRS) of non-infectious origin without acute organ dysfunction: Secondary | ICD-10-CM

## 2017-07-03 DIAGNOSIS — T1490XA Injury, unspecified, initial encounter: Secondary | ICD-10-CM

## 2017-07-03 DIAGNOSIS — R0682 Tachypnea, not elsewhere classified: Secondary | ICD-10-CM

## 2017-07-03 DIAGNOSIS — D72829 Elevated white blood cell count, unspecified: Secondary | ICD-10-CM

## 2017-07-03 DIAGNOSIS — R000 Tachycardia, unspecified: Secondary | ICD-10-CM

## 2017-07-03 LAB — MRSA PCR SCREENING: MRSA by PCR: NEGATIVE

## 2017-07-03 MED ORDER — HALOPERIDOL LACTATE 5 MG/ML IJ SOLN
5.0000 mg | Freq: Four times a day (QID) | INTRAMUSCULAR | Status: DC | PRN
  Administered 2017-07-03: 5 mg via INTRAMUSCULAR
  Filled 2017-07-03: qty 1

## 2017-07-03 MED ORDER — POLYETHYLENE GLYCOL 3350 17 G PO PACK
17.0000 g | PACK | Freq: Every day | ORAL | Status: DC
  Administered 2017-07-03 – 2017-07-04 (×2): 17 g via ORAL
  Filled 2017-07-03 (×2): qty 1
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
## 2017-07-03 NOTE — Progress Notes (Signed)
Patient family asking for patient's belongings from security.  Belongings obtained from security and given to patient daughter, Augusto GambleJody.  Belongings included black wallet, keys and phone.

## 2017-07-03 NOTE — Progress Notes (Signed)
I introduced myself to patient at bedside and then contacted his Mom by phone. I began discussions concerning a possible inpt rehab admit with pt's Mom. She works nights and pt's sister, Clair Gullingicki, will also assist her in providing 24/7 assist at d/c. Mom reports he in uninsured. I will follow up with Mom when she visits Tuesday to continue discussions concerning goals and expectations of am inpt rehab admit. [phone]

## 2017-07-03 NOTE — Progress Notes (Signed)
Patient chair alarm going off 1345, patient found to be attempting to get out of chair by removing posey belt. Pt would not return to bed, RN called for second staff member at this time. Ativan given when in bed and restraint applied. 1430 bed alarm going off, pt found at end of bed after pulled out IV, condom cath, out of restraint, and without gown on stating "I'm leaving, I'm walking out of here now."  Called for second staff member and paged trauma PA. Orders obtained for haldol IM and bilat ankle and wrist soft restraints--gave haldol first, and 4 point restraints were not needed (order d/c'd).  Patient resting this afternoon, agreeable to restart IV.  Continue to monitor.
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
## 2017-07-04 ENCOUNTER — Encounter (HOSPITAL_COMMUNITY): Payer: Self-pay | Admitting: *Deleted

## 2017-07-04 LAB — DRUG PROFILE, UR, 9 DRUGS (LABCORP)
Amphetamines, Urine: NEGATIVE ng/mL
BARBITURATE, UR: NEGATIVE ng/mL
BENZODIAZEPINE QUANT UR: NEGATIVE
Cannabinoid Quant, Ur: POSITIVE — AB
Cocaine (Metab.): NEGATIVE ng/mL
Methadone Screen, Urine: NEGATIVE ng/mL
Opiate Quant, Ur: NEGATIVE ng/mL
PHENCYCLIDINE, UR: NEGATIVE ng/mL
Propoxyphene, Urine: NEGATIVE ng/mL
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
## 2017-07-04 NOTE — Progress Notes (Signed)
I met with pt's Mom at his bedside. She states she feels that in another couple of days and she will be then ready to take him home. I discussed an enclosure bed and she states she sees no reason for enclosure or restraints. She states he only tries to get up when he wants to use the bathroom. I will follow his progress, but I do not know if Mom is realistic in her expectations. [phone]
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
## 2017-07-05 LAB — DRUG PROFILE, UR, 9 DRUGS (LABCORP)
AMPHETAMINES, URINE: NEGATIVE ng/mL
Barbiturate, Ur: NEGATIVE ng/mL
Benzodiazepine Quant, Ur: NEGATIVE ng/mL
CANNABINOID QUANT UR: POSITIVE — AB
COCAINE (METAB.): NEGATIVE ng/mL
METHADONE SCREEN, URINE: NEGATIVE ng/mL
OPIATE QUANT UR: NEGATIVE ng/mL
PHENCYCLIDINE, UR: NEGATIVE ng/mL
PROPOXYPHENE, URINE: NEGATIVE ng/mL

## 2017-07-05 NOTE — Progress Notes (Signed)
Approached patient's room when bed alarm going off and saw physical therapist and security already in the room. Patient was walking to bathroom and aggressive with therapist. Mother and patient quickly educated in regards to putting hands on healthcare workers and what that entails. Patient stated to all staff in the room that he wanted to go home and was "ready right now". Patient was able to state name and DOB and was A&Ox4. Educated patient on his weakness and fall risk and included mom in this education, as well. Both patient and mother understood this and what the AMA paper meant. MD notified. Patient was assisted with his belongings, signed AMA paper and was assisted off the unit with staff and security. Jillyn HiddenStone,Keaundra Stehle R, RN

## 2017-07-05 NOTE — Progress Notes (Signed)
Patient daughter is at the bedside and is attending to patient. Patient is calm and cooperative. Patient given drink and snack. Bed alarm is on for safety. Will continue to monitor.

## 2017-07-05 NOTE — Progress Notes (Signed)
Patient is lying in the bed with eyes closed. Patient bed alarm is on for safety. Will cont to monitor.
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
## 2017-07-05 NOTE — Clinical Social Work Note (Signed)
Clinical Social Worker attempted SBIRT x2 days, however due to patient confusion, unable to complete.  Per chart, patient alert and oriented this morning and chose to leave AMA prior to CSW return visit.  No SBIRT completed on this patient due to leaving AMA.  No further CSW needs at this time.  Macario GoldsJesse Sylvester Minton, Kentucky[place]CSW [phone]
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
## 2017-07-06 ENCOUNTER — Encounter (HOSPITAL_COMMUNITY): Payer: Self-pay | Admitting: Emergency Medicine
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
## 2017-11-29 ENCOUNTER — Emergency Department (HOSPITAL_COMMUNITY)
Admission: EM | Admit: 2017-11-29 | Discharge: 2017-11-29 | Disposition: A | Discharge status: Home / Self Care | Payer: Self-pay | Attending: Emergency Medicine | Admitting: Emergency Medicine

## 2017-11-29 ENCOUNTER — Encounter (HOSPITAL_COMMUNITY): Payer: Self-pay | Admitting: Emergency Medicine

## 2017-11-29 ENCOUNTER — Other Ambulatory Visit: Payer: Self-pay

## 2017-11-29 DIAGNOSIS — Z87891 Personal history of nicotine dependence: Secondary | ICD-10-CM | POA: Insufficient documentation

## 2017-11-29 DIAGNOSIS — R4182 Altered mental status, unspecified: Secondary | ICD-10-CM | POA: Insufficient documentation

## 2017-11-29 DIAGNOSIS — F0781 Postconcussional syndrome: Secondary | ICD-10-CM | POA: Insufficient documentation

## 2017-11-29 DIAGNOSIS — Z79899 Other long term (current) drug therapy: Secondary | ICD-10-CM | POA: Insufficient documentation

## 2017-11-29 LAB — CBC
HCT: 43 % (ref 39.0–52.0)
Hemoglobin: 14.3 g/dL (ref 13.0–17.0)
MCH: 30.4 pg (ref 26.0–34.0)
MCHC: 33.3 g/dL (ref 30.0–36.0)
MCV: 91.3 fL (ref 78.0–100.0)
PLATELETS: 283 10*3/uL (ref 150–400)
RBC: 4.71 MIL/uL (ref 4.22–5.81)
RDW: 14 % (ref 11.5–15.5)
WBC: 7.5 10*3/uL (ref 4.0–10.5)

## 2017-11-29 LAB — COMPREHENSIVE METABOLIC PANEL
ALBUMIN: 3.8 g/dL (ref 3.5–5.0)
ALT: 11 U/L (ref 0–44)
AST: 17 U/L (ref 15–41)
Alkaline Phosphatase: 61 U/L (ref 38–126)
Anion gap: 8 (ref 5–15)
BILIRUBIN TOTAL: 0.6 mg/dL (ref 0.3–1.2)
BUN: 7 mg/dL (ref 6–20)
CO2: 26 mmol/L (ref 22–32)
Calcium: 8.8 mg/dL — ABNORMAL LOW (ref 8.9–10.3)
Chloride: 107 mmol/L (ref 98–111)
Creatinine, Ser: 1 mg/dL (ref 0.61–1.24)
GFR calc Af Amer: >60 mL/min (ref >60)
GFR calc non Af Amer: >60 mL/min (ref >60)
Glucose, Bld: 120 mg/dL — ABNORMAL HIGH (ref 70–99)
Potassium: 3.7 mmol/L (ref 3.5–5.1)
SODIUM: 141 mmol/L (ref 135–145)
Total Protein: 6.5 g/dL (ref 6.5–8.1)

## 2017-11-29 MED ORDER — MECLIZINE HCL 25 MG PO TABS: 25.0000 mg | ORAL_TABLET | Freq: Three times a day (TID) | ORAL | 0 refills | Status: AC | PRN
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
## 2017-11-29 NOTE — ED Notes (Signed)
Pt ambulated without difficulty
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
## 2018-01-08 ENCOUNTER — Encounter: Payer: Self-pay | Admitting: Diagnostic Neuroimaging

## 2018-01-08 ENCOUNTER — Encounter

## 2018-01-08 ENCOUNTER — Ambulatory Visit: Payer: Self-pay | Admitting: Diagnostic Neuroimaging

## 2018-01-08 VITALS — BP 138/77 | HR 61 | Ht 69.0 in | Wt 156.4 lb

## 2018-01-08 DIAGNOSIS — F0781 Postconcussional syndrome: Secondary | ICD-10-CM
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
## 2018-01-08 NOTE — Patient Instructions (Signed)
  POST CONCUSSION SYNDROME - improve exercise, nutrition habits - consider therapy evaluations (PT, OT) - consider SSRI for mood stabilization

## 2018-02-13 DIAGNOSIS — Z0271 Encounter for disability determination: Secondary | ICD-10-CM

## 2019-03-02 ENCOUNTER — Emergency Department (HOSPITAL_COMMUNITY)
Admission: EM | Admit: 2019-03-02 | Discharge: 2019-03-02 | Disposition: A | Discharge status: Home / Self Care | Payer: Self-pay | Attending: Emergency Medicine | Admitting: Emergency Medicine

## 2019-03-02 ENCOUNTER — Emergency Department (HOSPITAL_COMMUNITY): Payer: Self-pay

## 2019-03-02 ENCOUNTER — Encounter (HOSPITAL_COMMUNITY): Payer: Self-pay | Admitting: Emergency Medicine

## 2019-03-02 ENCOUNTER — Other Ambulatory Visit: Payer: Self-pay

## 2019-03-02 DIAGNOSIS — M25521 Pain in right elbow: Secondary | ICD-10-CM

## 2019-03-02 DIAGNOSIS — M779 Enthesopathy, unspecified: Secondary | ICD-10-CM | POA: Insufficient documentation

## 2019-03-02 DIAGNOSIS — Z87891 Personal history of nicotine dependence: Secondary | ICD-10-CM | POA: Insufficient documentation

## 2019-03-02 NOTE — Discharge Instructions (Signed)
Contact a health care provider if: °Your symptoms do not improve after several days of treatment. °You have more redness, swelling, or pain in your elbow. °You have difficulty moving the injured area. °Your swelling or pain is not relieved with medicines. °Get help right away if: °Your skin over the contusion breaks and starts bleeding. °You have severe pain. °You have numbness in your hand or fingers. °Your hand or fingers turn pale or cold. °You have swelling of your hand and fingers. °You cannot move your fingers or wrist. °

## 2019-03-02 NOTE — ED Triage Notes (Signed)
Patient here from home with complaints of right elbow pain. Reports that he was leaning on it and thinks it "came out of place".
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
## 2019-08-15 ENCOUNTER — Emergency Department (HOSPITAL_COMMUNITY)
Admission: EM | Admit: 2019-08-15 | Discharge: 2019-08-15 | Disposition: A | Discharge status: Home / Self Care | Payer: Self-pay | Attending: Emergency Medicine | Admitting: Emergency Medicine

## 2019-08-15 ENCOUNTER — Encounter (HOSPITAL_COMMUNITY): Payer: Self-pay

## 2019-08-15 ENCOUNTER — Other Ambulatory Visit: Payer: Self-pay

## 2019-08-15 DIAGNOSIS — M25511 Pain in right shoulder: Secondary | ICD-10-CM | POA: Insufficient documentation

## 2019-08-15 DIAGNOSIS — M542 Cervicalgia: Secondary | ICD-10-CM | POA: Insufficient documentation

## 2019-08-15 DIAGNOSIS — Z79899 Other long term (current) drug therapy: Secondary | ICD-10-CM | POA: Insufficient documentation

## 2019-08-15 DIAGNOSIS — Z87891 Personal history of nicotine dependence: Secondary | ICD-10-CM | POA: Insufficient documentation

## 2019-08-15 DIAGNOSIS — R519 Headache, unspecified: Secondary | ICD-10-CM | POA: Insufficient documentation

## 2019-08-15 MED ORDER — ACETAMINOPHEN 325 MG PO TABS
650.0000 mg | ORAL_TABLET | Freq: Once | ORAL | Status: AC
  Administered 2019-08-15: 650 mg via ORAL
  Filled 2019-08-15: qty 2
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
## 2019-11-12 ENCOUNTER — Emergency Department (HOSPITAL_COMMUNITY)
Admission: EM | Admit: 2019-11-12 | Discharge: 2019-11-12 | Disposition: A | Discharge status: Home / Self Care | Payer: Self-pay | Attending: Emergency Medicine | Admitting: Emergency Medicine

## 2019-11-12 ENCOUNTER — Encounter (HOSPITAL_COMMUNITY): Payer: Self-pay | Admitting: *Deleted

## 2019-11-12 DIAGNOSIS — S60562A Insect bite (nonvenomous) of left hand, initial encounter: Secondary | ICD-10-CM | POA: Insufficient documentation

## 2019-11-12 DIAGNOSIS — Y939 Activity, unspecified: Secondary | ICD-10-CM | POA: Insufficient documentation

## 2019-11-12 DIAGNOSIS — Z87891 Personal history of nicotine dependence: Secondary | ICD-10-CM | POA: Insufficient documentation

## 2019-11-12 DIAGNOSIS — Y999 Unspecified external cause status: Secondary | ICD-10-CM | POA: Insufficient documentation

## 2019-11-12 DIAGNOSIS — W57XXXA Bitten or stung by nonvenomous insect and other nonvenomous arthropods, initial encounter: Secondary | ICD-10-CM | POA: Insufficient documentation

## 2019-11-12 DIAGNOSIS — Y929 Unspecified place or not applicable: Secondary | ICD-10-CM | POA: Insufficient documentation

## 2019-11-12 MED ORDER — EPINEPHRINE 0.3 MG/0.3ML IJ SOAJ: 0.3000 mg | INTRAMUSCULAR | 0 refills | Status: AC | PRN

## 2019-11-12 MED ORDER — PREDNISONE 10 MG (21) PO TBPK: ORAL_TABLET | Freq: Every day | ORAL | 0 refills | Status: AC

## 2019-11-12 MED ORDER — PREDNISONE 20 MG PO TABS
60.0000 mg | ORAL_TABLET | Freq: Once | ORAL | Status: AC
  Administered 2019-11-12: 60 mg via ORAL
  Filled 2019-11-12: qty 3

## 2019-11-12 NOTE — ED Triage Notes (Addendum)
To ED for eval of left hand and arm swelling since being stung/bit by insect last pm 'right before dark'. States he thought it was a mosquito or baby yellow jacket on his hand - 'whelped up'. This am woke with swelling to left hand and arm. Left arm mild to moderately swollen compared to right arm. Left arm warmer to touch compared to right arm. Moves all digits without difficulty. Resp even and unlabored. Speech is clear. Voice not hoarse. No meds taken
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
## 2019-11-12 NOTE — ED Notes (Signed)
Patient verbalizes understanding of discharge instructions . Opportunity for questions and answers were provided . Armband removed by staff ,Pt discharged from ED. W/C  offered at D/C  and Declined W/C at D/C and was escorted to lobby by RN.  

## 2019-11-12 NOTE — Discharge Instructions (Addendum)
Use tylenol and motrin for symptoms pain.   Use cold compresses as well and keep your left arm elevated to help reduce the swelling.   You were given a prednisone taper to help with any possible allergic component of this reaction.   You were given a prescription for an EpiPen.  If you experience facial swelling, throat swelling, shortness of breath, wheezing or any symptoms to suggest a severe allergic reaction then you should use the EpiPen and report to the emergency department immediately  Please follow up with your primary care provider within 5-7 days for re-evaluation of your symptoms. If you do not have a primary care provider, information for a healthcare clinic has been provided for you to make arrangements for follow up care. Please return to the emergency department for any new or worsening symptoms.

## 2020-10-18 ENCOUNTER — Encounter (HOSPITAL_COMMUNITY): Payer: Self-pay | Admitting: *Deleted

## 2020-10-18 ENCOUNTER — Other Ambulatory Visit: Payer: Self-pay

## 2020-10-18 ENCOUNTER — Ambulatory Visit (HOSPITAL_COMMUNITY)
Admission: EM | Admit: 2020-10-18 | Discharge: 2020-10-18 | Disposition: A | Discharge status: Home / Self Care | Payer: Self-pay

## 2020-10-18 DIAGNOSIS — W19XXXA Unspecified fall, initial encounter: Secondary | ICD-10-CM

## 2020-10-18 DIAGNOSIS — S20211A Contusion of right front wall of thorax, initial encounter: Secondary | ICD-10-CM

## 2020-10-18 NOTE — ED Triage Notes (Signed)
Pt fell on deck last night and he hit his back.
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
## 2020-10-18 NOTE — Discharge Instructions (Addendum)
-  Head straight to [facility] emergency department for further evaluation of your rib and abdominal pain.  I am concerned that you have a cracked rib and possibly a punctured lung and/or abdominal organ injury.  This needs more imaging than we can provide here. -If you develop new symptoms on the way like dizziness, shortness of breath, weakness, chest pain-stop and call 911 immediately.

## 2021-05-04 ENCOUNTER — Emergency Department (HOSPITAL_COMMUNITY)
Admission: EM | Admit: 2021-05-04 | Discharge: 2021-05-04 | Disposition: A | Discharge status: Home / Self Care | Payer: Self-pay | Attending: Emergency Medicine | Admitting: Emergency Medicine

## 2021-05-04 DIAGNOSIS — U071 COVID-19: Secondary | ICD-10-CM | POA: Insufficient documentation

## 2021-05-04 DIAGNOSIS — Z87891 Personal history of nicotine dependence: Secondary | ICD-10-CM | POA: Insufficient documentation

## 2021-05-04 MED ORDER — NIRMATRELVIR/RITONAVIR (PAXLOVID)TABLET: 3.0000 | ORAL_TABLET | Freq: Two times a day (BID) | ORAL | 0 refills | Status: AC

## 2021-05-04 NOTE — ED Triage Notes (Signed)
Pt covid positive per home test, states he's "sick as shit." HA x4 days, febrile x4 days, weakness/malaise. Unvaccinated, "won't take medicine"

## 2021-05-04 NOTE — Discharge Instructions (Signed)
Please read and follow all provided instructions.  Your diagnoses today include:  1. COVID-19     Tests performed today include: Vital signs. See below for your results today.  COVID test - pending, check mychart for results  Medications prescribed:  Paxlovid : medication for COVID-19  Take any prescribed medications only as directed. Treatment for your infection is aimed at treating the symptoms. There are no medications, such as antibiotics, that will cure your infection.   Home care instructions:  Follow any educational materials contained in this packet.   Your illness is contagious and can be spread to others, especially during the first 3 or 4 days. It cannot be cured by antibiotics or other medicines. Take basic precautions such as washing your hands often, covering your mouth when you cough or sneeze, and avoiding public places where you could spread your illness to others.   Please continue drinking plenty of fluids.  Use over-the-counter medicines as needed as directed on packaging for symptom relief.  You may also use ibuprofen or tylenol as directed on packaging for pain or fever.  Do not take multiple medicines containing Tylenol or acetaminophen to avoid taking too much of this medication.  If you are positive for Covid-19, you should isolate yourself and not be exposed to other people for 5 days after your symptoms began. If you are not feeling better at day 5, you need to isolate yourself for a total of 10 days. If you are feeling better by day 5, you should wear a mask properly, over your nose and mouth, at all times while around other people until 10 days after your symptoms started.   Follow-up instructions: Please follow-up with your primary care provider as needed for further evaluation of your symptoms if you are not feeling better.   Return instructions:  Please return to the Emergency Department if you experience worsening symptoms.  Return to the emergency  department if you have worsening shortness of breath breathing or increased work of breathing, persistent vomiting RETURN IMMEDIATELY IF you develop shortness of breath, confusion or altered mental status, a new rash, become dizzy, faint, or poorly responsive, or are unable to be cared for at home. Please return if you have persistent vomiting and cannot keep down fluids or develop a fever that is not controlled by tylenol or motrin.   Please return if you have any other emergent concerns.  Additional Information:  Your vital signs today were: BP (!) 143/91 (BP Location: Right Arm)    Pulse 78    Temp 99.5 F (37.5 C) (Oral)    Resp 16    SpO2 99%  If your blood pressure (BP) was elevated above 135/85 this visit, please have this repeated by your doctor within one month. --------------
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
## 2021-05-05 ENCOUNTER — Emergency Department (HOSPITAL_COMMUNITY)
Admission: EM | Admit: 2021-05-05 | Discharge: 2021-05-05 | Disposition: A | Discharge status: Home / Self Care | Payer: Self-pay | Attending: Emergency Medicine | Admitting: Emergency Medicine

## 2021-05-05 ENCOUNTER — Other Ambulatory Visit: Payer: Self-pay

## 2021-05-05 DIAGNOSIS — Z87891 Personal history of nicotine dependence: Secondary | ICD-10-CM | POA: Insufficient documentation

## 2021-05-05 DIAGNOSIS — R112 Nausea with vomiting, unspecified: Secondary | ICD-10-CM

## 2021-05-05 DIAGNOSIS — U071 COVID-19: Secondary | ICD-10-CM | POA: Insufficient documentation

## 2021-05-05 MED ORDER — ONDANSETRON 4 MG PO TBDP: 4.0000 mg | ORAL_TABLET | Freq: Three times a day (TID) | ORAL | 0 refills | Status: AC | PRN

## 2021-05-05 MED ORDER — ONDANSETRON 4 MG PO TBDP
4.0000 mg | ORAL_TABLET | Freq: Once | ORAL | Status: AC
  Administered 2021-05-05: 12:00:00 4 mg via ORAL
  Filled 2021-05-05: qty 1

## 2021-05-05 NOTE — ED Triage Notes (Signed)
Pt was in ED yesterday and tested pos. For covid. Pt was given medication but unable to hold it down r/t to increase n/v. Pt requesting medication to help nausea.

## 2021-05-05 NOTE — Discharge Instructions (Signed)
Take the nausea medication 30 minutes before taking the medication for COVID-19.  Hopefully this will help keep your nausea under control.
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
## 2021-08-29 IMAGING — CR DG ELBOW COMPLETE 3+V*R*
4 series · 4 of 4 positions shown · non-contrast
Comparison: None.

CLINICAL DATA: 48-year-old male with right elbow pain.

EXAM:
RIGHT ELBOW - COMPLETE 3+ VIEW

[x elbow ap right]
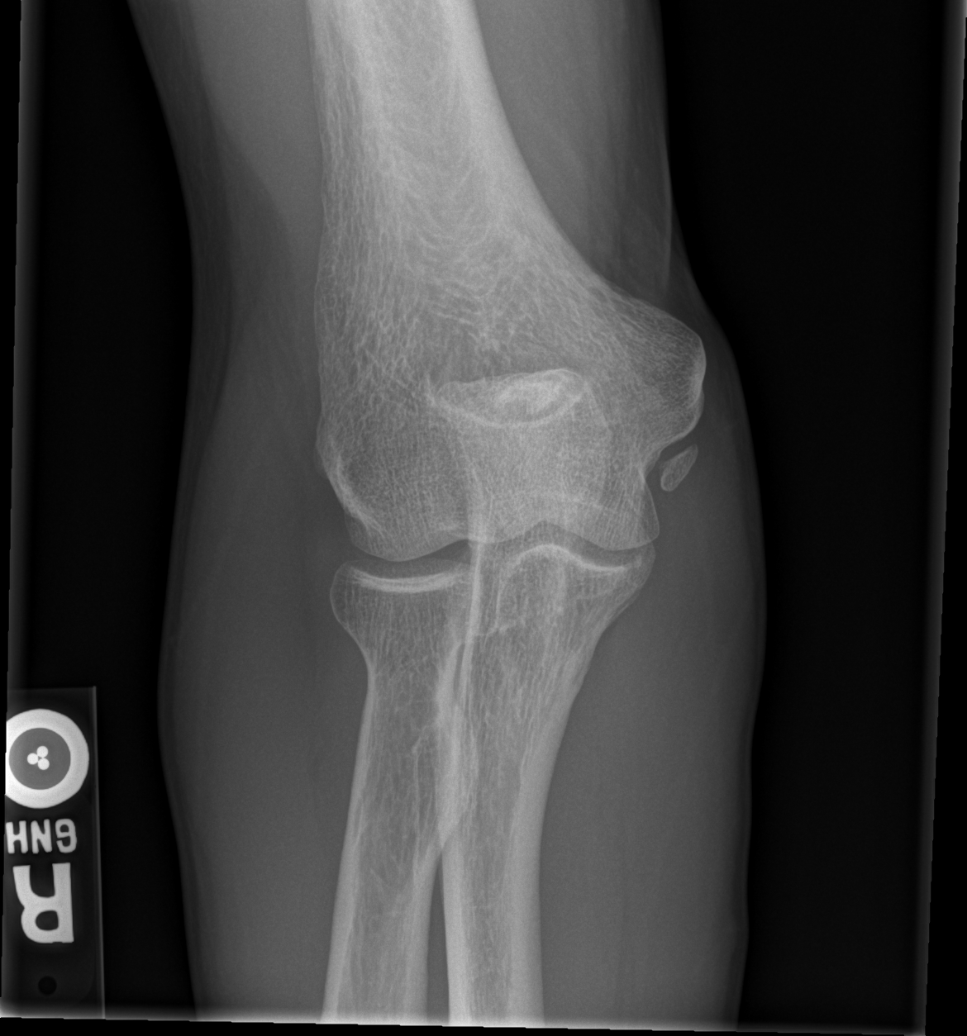

[x elbow obl right (1 of 2)]
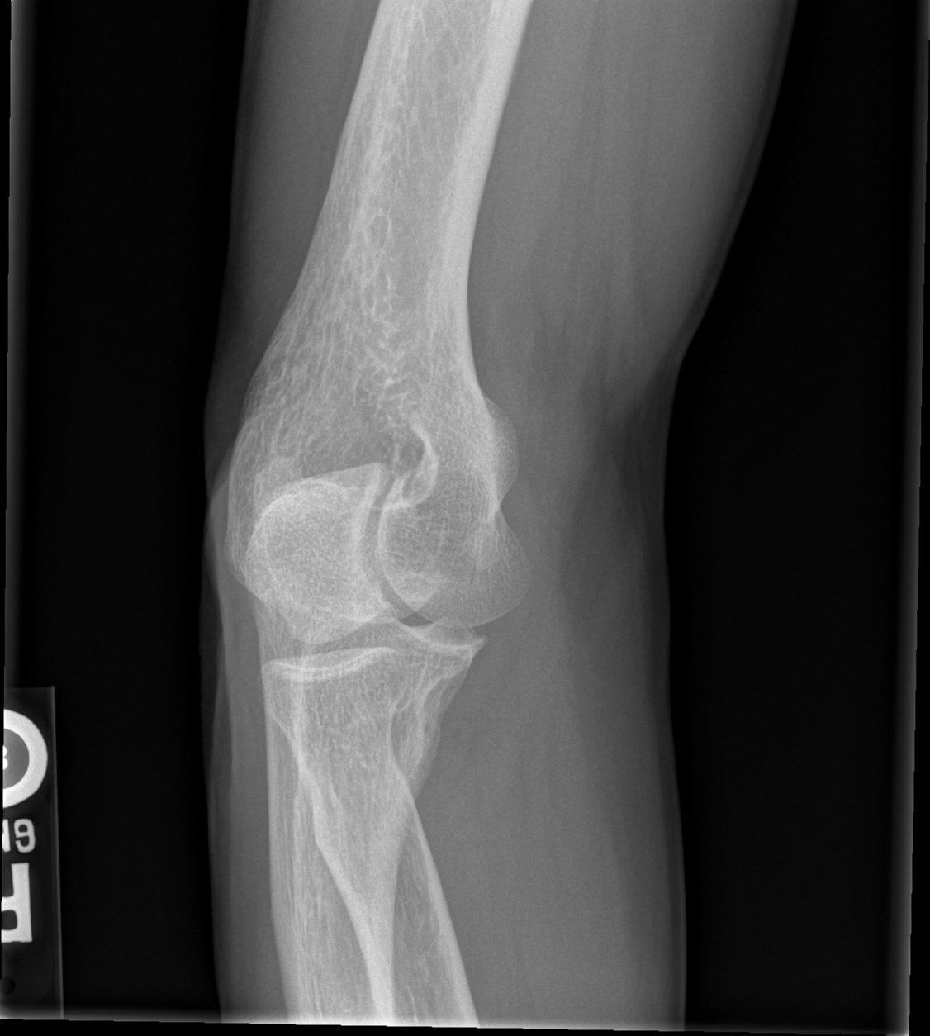

[x elbow obl right (2 of 2)]
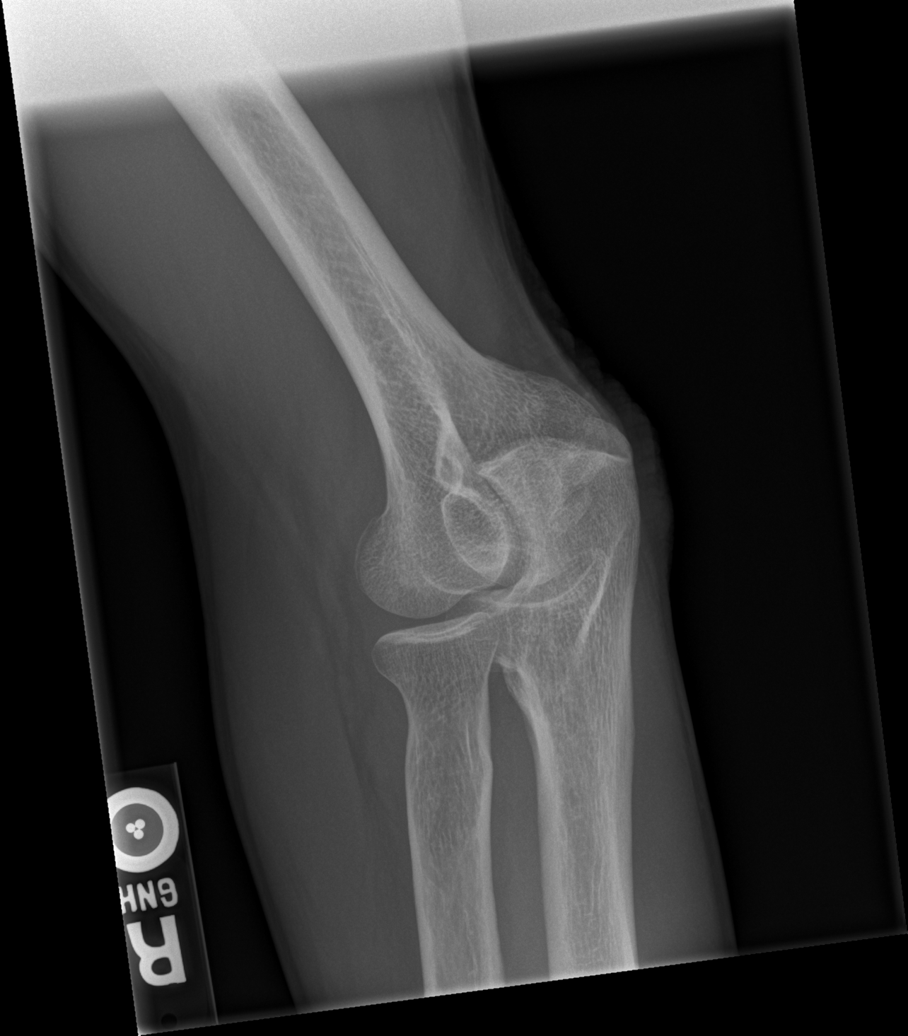

[x elbow lat right]
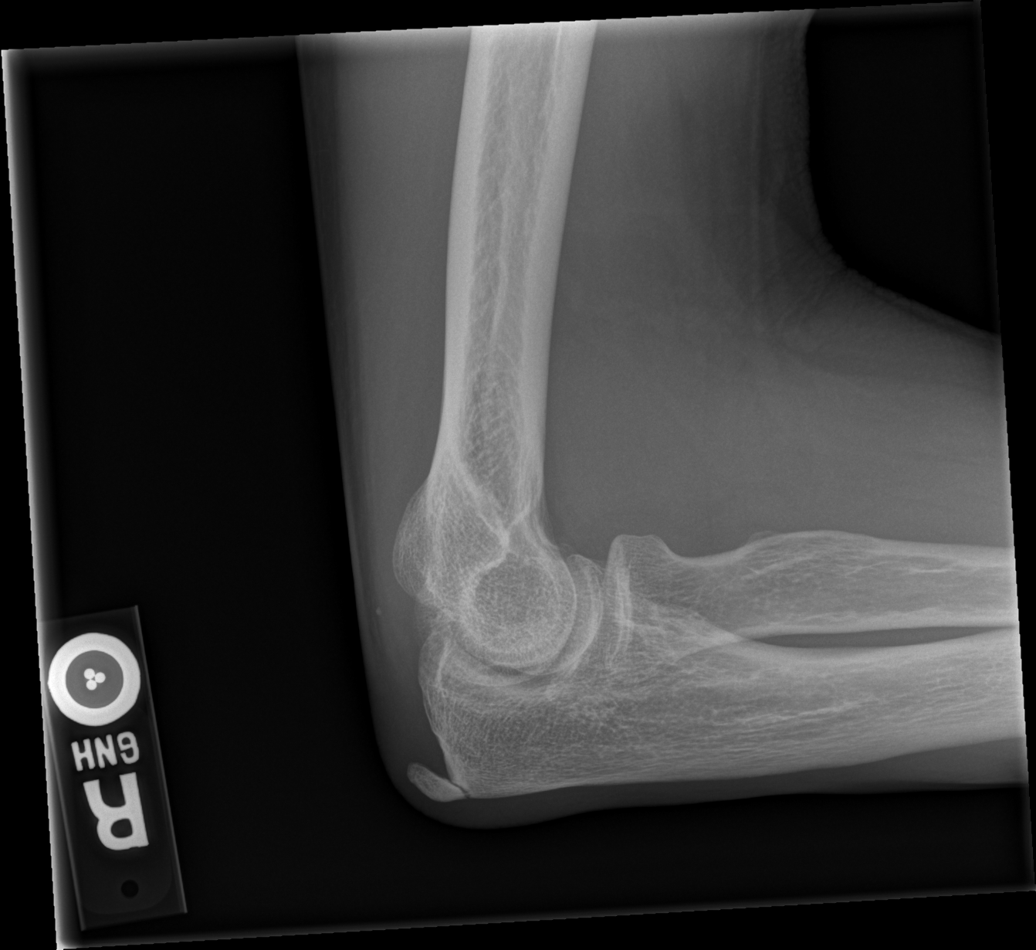

[4 of 4 positions shown; findings below may reference images not displayed]

FINDINGS: There is no acute fracture or dislocation. No arthritic changes.
There is a 12 mm bone spur from the dorsal olecranon. No joint
effusion. The soft tissues are unremarkable.
IMPRESSION: 1. No acute fracture or dislocation.
2. Small bone spur from the dorsal olecranon.
